# Patient Record
Sex: Male | Born: 1975
Health system: Southern US, Community
[De-identification: ages and names within clinical notes are randomized; demographics above are authoritative.]

## PROBLEM LIST (undated history)

## (undated) DIAGNOSIS — S2249XA Multiple fractures of ribs, unspecified side, initial encounter for closed fracture: Secondary | ICD-10-CM

## (undated) DIAGNOSIS — M329 Systemic lupus erythematosus, unspecified: Secondary | ICD-10-CM

## (undated) DIAGNOSIS — IMO0002 Reserved for concepts with insufficient information to code with codable children: Secondary | ICD-10-CM

## (undated) HISTORY — PX: SPLENECTOMY, PARTIAL: SHX787

---

## 2020-01-09 ENCOUNTER — Ambulatory Visit (HOSPITAL_COMMUNITY)
Admission: EM | Admit: 2020-01-09 | Discharge: 2020-01-09 | Disposition: A | Discharge status: Home / Self Care | Payer: Self-pay | Attending: Physician Assistant | Admitting: Physician Assistant

## 2020-01-09 ENCOUNTER — Other Ambulatory Visit: Payer: Self-pay

## 2020-01-09 ENCOUNTER — Ambulatory Visit (INDEPENDENT_AMBULATORY_CARE_PROVIDER_SITE_OTHER): Payer: Self-pay

## 2020-01-09 ENCOUNTER — Encounter (HOSPITAL_COMMUNITY): Payer: Self-pay

## 2020-01-09 DIAGNOSIS — R0781 Pleurodynia: Secondary | ICD-10-CM

## 2020-01-09 HISTORY — DX: Reserved for concepts with insufficient information to code with codable children: IMO0002

## 2020-01-09 HISTORY — DX: Multiple fractures of ribs, unspecified side, initial encounter for closed fracture: S22.49XA

## 2020-01-09 HISTORY — DX: Systemic lupus erythematosus, unspecified: M32.9

## 2020-01-09 MED ORDER — DICLOFENAC SODIUM 1 % EX GEL: 4.0000 g | Freq: Four times a day (QID) | CUTANEOUS | 0 refills | Status: DC

## 2020-01-09 MED ORDER — NAPROXEN 500 MG PO TABS
500.0000 mg | ORAL_TABLET | Freq: Two times a day (BID) | ORAL | 0 refills | Status: DC
Start: 2020-01-09 — End: 2020-04-26

## 2020-01-09 NOTE — ED Provider Notes (Signed)
[facility]    CSN: [id number] Arrival date & time: 01/09/20  1259      History   Chief Complaint Chief Complaint  Patient presents with  . Flank Pain    HPI Jacob Miller is a 44 y.o. male.   Patient with history of broken ribs on the left side, splenectomy and lupus presents for left lower rib pain.  He reports he had this pain on and off over the years.  He does report he had this 1 time when he had an issue with his lung requiring hospitalization and is concerned that his lungs have any issue.  Describes sharp pain left ribs worse with deep breath.  He reports is intermittent.  He has report is been a little more persistent today.  Reports it is tender to touch.  He reports certain movements make it worse when reaching above head.  He reports he works out and does a lot of overhead pulling exercises.  He has not fallen on the ribs.  Denies difficulty breathing outside of when he takes a deep breath and he feels the pain.  No central chest pain, no nausea or sweating.  No painful urination, frequency or blood in his urine.  Moving his bowels as per usual.  Patient recently quit smoking and was a heavy smoker prior.  He does endorse some coughing over the preceding weeks due to smoking.     Past Medical History:  Diagnosis Date  . Broken ribs   . Lupus (HCC)     There are no problems to display for this patient.   Past Surgical History:  Procedure Laterality Date  . SPLENECTOMY, PARTIAL         Home Medications    Prior to Admission medications   Medication Sig Start Date End Date Taking? Authorizing Provider  diclofenac Sodium (VOLTAREN) 1 % GEL Apply 4 g topically 4 (four) times daily. 01/09/20   Khari Lett, Veryl Speak, PA-C  naproxen (NAPROSYN) 500 MG tablet Take 1 tablet (500 mg total) by mouth 2 (two) times daily. 01/09/20   Meryn Sarracino, Veryl Speak, PA-C    Family History Family History  Family history unknown: Yes    Social History Social History   Tobacco  Use  . Smoking status: Not on file  Substance Use Topics  . Alcohol use: Not on file  . Drug use: Not on file     Allergies   Patient has no known allergies.   Review of Systems Review of Systems   Physical Exam Triage Vital Signs ED Triage Vitals  Enc Vitals Group     BP 01/09/20 1450 (!) 143/85     Pulse Rate 01/09/20 1450 58     Resp 01/09/20 1450 16     Temp 01/09/20 1450 98.6 F (37 C)     Temp Source 01/09/20 1450 Oral     SpO2 01/09/20 1450 97 %     Weight --      Height --      Head Circumference --      Peak Flow --      Pain Score 01/09/20 1455 8     Pain Loc --      Pain Edu? --      Excl. in GC? --    No data found.  Updated Vital Signs BP (!) 143/85 (BP Location: Right Arm)   Pulse 58   Temp 98.6 F (37 C) (Oral)   Resp 16   SpO2 97%  Visual Acuity Right Eye Distance:   Left Eye Distance:   Bilateral Distance:    Right Eye Near:   Left Eye Near:    Bilateral Near:     Physical Exam Vitals and nursing note reviewed.  Constitutional:      General: He is not in acute distress.    Appearance: He is well-developed. He is not ill-appearing.  HENT:     Head: Normocephalic and atraumatic.  Eyes:     Conjunctiva/sclera: Conjunctivae normal.  Cardiovascular:     Rate and Rhythm: Normal rate and regular rhythm.     Heart sounds: No murmur heard.   Pulmonary:     Effort: Pulmonary effort is normal. No respiratory distress.     Breath sounds: Normal breath sounds. No wheezing, rhonchi or rales.     Comments: Equal rise and fall the chest.  Lung sounds equal bilaterally through all fields. Abdominal:     Palpations: Abdomen is soft.     Tenderness: There is no abdominal tenderness.  Musculoskeletal:     Cervical back: Neck supple.     Comments: Tenderness to palpation along the left mid axillary lower ribs.  There is pain elicited with stretching of the arm above the head.  No pain with compression of thoracic cage.  There are no rashes.   No overlying erythema or ecchymosis  Skin:    General: Skin is warm and dry.  Neurological:     Mental Status: He is alert.      UC Treatments / Results  Labs (all labs ordered are listed, but only abnormal results are displayed) Labs Reviewed - No data to display  EKG   Radiology DG Chest 2 View  Result Date: 01/09/2020 CLINICAL DATA:  44 year old male with left side rib pain for 1 week. Tested negative last week for COVID-19. EXAM: CHEST - 2 VIEW COMPARISON:  None. FINDINGS: Cardiac size is at the upper limits of normal to mildly enlarged. Other mediastinal contours are within normal limits. Visualized tracheal air column is within normal limits. Both lungs appear clear. No pneumothorax or pleural effusion. No acute osseous abnormality identified. There is a small linear surgical clip or other retained foreign body adjacent to the splenic flexure beneath the left hemidiaphragm. Negative visible bowel gas pattern. IMPRESSION: Borderline to mild cardiomegaly. But no acute radiographic finding in the chest. Electronically Signed   By: Odessa Fleming M.D.   On: 01/09/2020 16:19    Procedures Procedures (including critical care time)  Medications Ordered in UC Medications - No data to display  Initial Impression / Assessment and Plan / UC Course  I have reviewed the triage vital signs and the nursing notes.  Pertinent labs & imaging results that were available during my care of the patient were reviewed by me and considered in my medical decision making (see chart for details).     #Left rib pain Patient is a 44 year old presenting with left rib pain.  X-ray obtained to rule out pneumothorax.  No sign of pneumothorax.  Given reproducibility on external exam doubt cardiac and doubt intrathoracic at this point.  Likely musculoskeletal or costochondritis.  Will treat with NSAIDs.  Return and ED precautions were discussed.  Instructed patient that he should establish with a primary care  provider.  Resource given.  Patient verbalized understanding plan of care Final Clinical Impressions(s) / UC Diagnoses   Final diagnoses:  Rib pain on left side     Discharge Instructions     I  have reviewed your chest xray, we do not see anything obvious with regard to your lungs  Take the naproxen if having frequent pains Use gel up to 4 times a day  Establish with Primary care, call the internal medicine center  If severe pain with difficulty breathing, please be evaluated by a medical provider      ED Prescriptions    Medication Sig Dispense Auth. Provider   naproxen (NAPROSYN) 500 MG tablet Take 1 tablet (500 mg total) by mouth 2 (two) times daily. 30 tablet Terrion Gencarelli, Veryl Speak, PA-C   diclofenac Sodium (VOLTAREN) 1 % GEL Apply 4 g topically 4 (four) times daily. 150 g Calin Ellery, Veryl Speak, PA-C     PDMP not reviewed this encounter.   Hermelinda Medicus, PA-C 01/09/20 1625

## 2020-01-09 NOTE — Discharge Instructions (Signed)
I have reviewed your chest xray, we do not see anything obvious with regard to your lungs  Take the naproxen if having frequent pains Use gel up to 4 times a day  Establish with Primary care, call the internal medicine center  If severe pain with difficulty breathing, please be evaluated by a medical provider

## 2020-01-09 NOTE — ED Triage Notes (Signed)
Pt c/o sharp pain to left flank area when taking a deep breath or coughing, denies recent injury, states he broke ribs on that side years ago

## 2020-04-26 ENCOUNTER — Other Ambulatory Visit: Payer: Self-pay

## 2020-04-26 ENCOUNTER — Encounter (HOSPITAL_COMMUNITY): Payer: Self-pay

## 2020-04-26 ENCOUNTER — Ambulatory Visit (HOSPITAL_COMMUNITY)
Admission: EM | Admit: 2020-04-26 | Discharge: 2020-04-26 | Disposition: A | Discharge status: Home / Self Care | Payer: Self-pay | Attending: Family Medicine | Admitting: Family Medicine

## 2020-04-26 DIAGNOSIS — K122 Cellulitis and abscess of mouth: Secondary | ICD-10-CM

## 2020-04-26 MED ORDER — CLINDAMYCIN HCL 300 MG PO CAPS
300.0000 mg | ORAL_CAPSULE | Freq: Three times a day (TID) | ORAL | 0 refills | Status: AC
Start: 2020-04-26 — End: ?

## 2020-04-26 MED ORDER — HYDROCODONE-ACETAMINOPHEN 7.5-325 MG PO TABS: 1.0000 | ORAL_TABLET | Freq: Four times a day (QID) | ORAL | 0 refills | Status: AC | PRN

## 2020-04-26 NOTE — ED Provider Notes (Signed)
[facility]    CSN: [id number] Arrival date & time: 04/26/20  2952      History   Chief Complaint Chief Complaint  Patient presents with  . Mouth Ulcer    HPI Jacob Miller is a 44 y.o. male.   HPI   Pain in the mouth, under the upper lip worsening for a couple of days Took 600 mg of ibuprofen without relief   Past Medical History:  Diagnosis Date  . Broken ribs   . Lupus (HCC)     There are no problems to display for this patient.   Past Surgical History:  Procedure Laterality Date  . SPLENECTOMY, PARTIAL         Home Medications    Prior to Admission medications   Medication Sig Start Date End Date Taking? Authorizing Provider  clindamycin (CLEOCIN) 300 MG capsule Take 1 capsule (300 mg total) by mouth 3 (three) times daily. 04/26/20   Jacob Moore, MD  HYDROcodone-acetaminophen (NORCO) 7.5-325 MG tablet Take 1 tablet by mouth every 6 (six) hours as needed for moderate pain. 04/26/20   Jacob Moore, MD    Family History Family History  Family history unknown: Yes    Social History Social History   Tobacco Use  . Smoking status: Not on file  Substance Use Topics  . Alcohol use: Not on file  . Drug use: Not on file     Allergies   Patient has no known allergies.   Review of Systems Review of Systems See HPI  Physical Exam Triage Vital Signs ED Triage Vitals  Enc Vitals Group     BP 04/26/20 0827 118/79     Pulse Rate 04/26/20 0827 98     Resp 04/26/20 0827 18     Temp 04/26/20 0827 98.3 F (36.8 C)     Temp Source 04/26/20 0827 Oral     SpO2 04/26/20 0827 98 %     Weight --      Height --      Head Circumference --      Peak Flow --      Pain Score 04/26/20 0828 6     Pain Loc --      Pain Edu? --      Excl. in GC? --    No data found.  Updated Vital Signs BP 118/79 (BP Location: Right Arm)   Pulse 98   Temp 98.3 F (36.8 C) (Oral)   Resp 18   SpO2 98%      Physical  Exam Constitutional:      General: He is not in acute distress.    Appearance: He is well-developed and normal weight.  HENT:     Head: Normocephalic and atraumatic.     Mouth/Throat:   Eyes:     Conjunctiva/sclera: Conjunctivae normal.     Pupils: Pupils are equal, round, and reactive to light.  Cardiovascular:     Rate and Rhythm: Normal rate.  Pulmonary:     Effort: Pulmonary effort is normal. No respiratory distress.  Abdominal:     General: There is no distension.     Palpations: Abdomen is soft.  Musculoskeletal:        General: Normal range of motion.     Cervical back: Normal range of motion.  Skin:    General: Skin is warm and dry.  Neurological:     Mental Status: He is alert.      UC Treatments / Results  Labs (all labs ordered are listed, but only abnormal results are displayed) Labs Reviewed - No data to display  EKG   Radiology No results found.  Procedures Procedures (including critical care time)  Medications Ordered in UC Medications - No data to display  Initial Impression / Assessment and Plan / UC Course  I have reviewed the triage vital signs and the nursing notes.  Pertinent labs & imaging results that were available during my care of the patient were reviewed by me and considered in my medical decision making (see chart for details).     Likely dental infection that is causing swelling of the gums and face.  Will treat with antibiotics warm compresses.  Return if this fails to improve.  Importance of dental care reviewed Final Clinical Impressions(s) / UC Diagnoses   Final diagnoses:  Oral infection     Discharge Instructions     Take the antibiotic as directed Warm compresses to area Take ibuprofen for moderate pain Take hydrocodone if pain severe See dentist in follow up   ED Prescriptions    Medication Sig Dispense Auth. Provider   clindamycin (CLEOCIN) 300 MG capsule Take 1 capsule (300 mg total) by mouth 3 (three)  times daily. 15 capsule Jacob Moore, MD   HYDROcodone-acetaminophen [facility]) 7.5-325 MG tablet Take 1 tablet by mouth every 6 (six) hours as needed for moderate pain. 10 tablet Jacob Moore, MD     I have reviewed the PDMP during this encounter.   Jacob Moore, MD 04/26/20 1327

## 2020-04-26 NOTE — ED Triage Notes (Signed)
Pt presents with mouth ulcer on top left side X 2 days.

## 2020-04-26 NOTE — Discharge Instructions (Addendum)
Take the antibiotic as directed Warm compresses to area Take ibuprofen for moderate pain Take hydrocodone if pain severe See dentist in follow up

## 2020-07-30 ENCOUNTER — Other Ambulatory Visit: Payer: Self-pay | Admitting: Physician Assistant

## 2020-07-30 DIAGNOSIS — F17200 Nicotine dependence, unspecified, uncomplicated: Secondary | ICD-10-CM | POA: Diagnosis not present

## 2020-07-30 DIAGNOSIS — N63 Unspecified lump in unspecified breast: Secondary | ICD-10-CM | POA: Diagnosis not present

## 2020-07-30 DIAGNOSIS — N6320 Unspecified lump in the left breast, unspecified quadrant: Secondary | ICD-10-CM

## 2020-07-30 DIAGNOSIS — M329 Systemic lupus erythematosus, unspecified: Secondary | ICD-10-CM | POA: Diagnosis not present

## 2020-08-29 ENCOUNTER — Ambulatory Visit
Admission: RE | Admit: 2020-08-29 | Discharge: 2020-08-29 | Disposition: A | Discharge status: Home / Self Care | Payer: 59 | Source: Ambulatory Visit | Attending: Physician Assistant | Admitting: Physician Assistant

## 2020-08-29 ENCOUNTER — Other Ambulatory Visit: Payer: Self-pay

## 2020-08-29 DIAGNOSIS — N6320 Unspecified lump in the left breast, unspecified quadrant: Secondary | ICD-10-CM

## 2020-08-29 DIAGNOSIS — R928 Other abnormal and inconclusive findings on diagnostic imaging of breast: Secondary | ICD-10-CM | POA: Diagnosis not present

## 2020-09-04 ENCOUNTER — Other Ambulatory Visit (HOSPITAL_COMMUNITY): Payer: Self-pay | Admitting: Internal Medicine

## 2020-09-04 MED FILL — HYDROXYCHLOROQUINE SULFATE: 200 | 30 days supply | Qty: 30 | Fill #0

## 2020-09-10 ENCOUNTER — Other Ambulatory Visit (HOSPITAL_COMMUNITY): Payer: Self-pay

## 2020-09-10 DIAGNOSIS — F17200 Nicotine dependence, unspecified, uncomplicated: Secondary | ICD-10-CM | POA: Diagnosis not present

## 2020-09-10 DIAGNOSIS — M329 Systemic lupus erythematosus, unspecified: Secondary | ICD-10-CM | POA: Diagnosis not present

## 2020-09-10 DIAGNOSIS — Z9081 Acquired absence of spleen: Secondary | ICD-10-CM | POA: Diagnosis not present

## 2020-09-10 DIAGNOSIS — Z79899 Other long term (current) drug therapy: Secondary | ICD-10-CM | POA: Diagnosis not present

## 2020-09-10 DIAGNOSIS — R634 Abnormal weight loss: Secondary | ICD-10-CM | POA: Diagnosis not present

## 2020-09-10 DIAGNOSIS — N182 Chronic kidney disease, stage 2 (mild): Secondary | ICD-10-CM | POA: Diagnosis not present

## 2020-09-10 DIAGNOSIS — M2550 Pain in unspecified joint: Secondary | ICD-10-CM | POA: Diagnosis not present

## 2020-09-10 MED FILL — predniSONE 10 MG TABS: 10 | 8 days supply | Qty: 20 | Fill #0

## 2020-09-10 MED FILL — IBUPROFEN 600 MG TABLET: 600 | 30 days supply | Qty: 60 | Fill #0

## 2020-09-25 ENCOUNTER — Other Ambulatory Visit (HOSPITAL_COMMUNITY): Payer: Self-pay

## 2020-09-27 ENCOUNTER — Other Ambulatory Visit (HOSPITAL_COMMUNITY): Payer: Self-pay

## 2020-10-02 ENCOUNTER — Other Ambulatory Visit (HOSPITAL_COMMUNITY): Payer: Self-pay

## 2020-10-16 DIAGNOSIS — H524 Presbyopia: Secondary | ICD-10-CM | POA: Diagnosis not present

## 2020-10-28 ENCOUNTER — Other Ambulatory Visit (HOSPITAL_COMMUNITY): Payer: Self-pay

## 2020-10-29 ENCOUNTER — Other Ambulatory Visit (HOSPITAL_COMMUNITY): Payer: Self-pay

## 2020-10-29 MED ORDER — HYDROXYCHLOROQUINE SULFATE 200 MG PO TABS
200.0000 mg | ORAL_TABLET | Freq: Every day | ORAL | 1 refills | Status: AC
  Filled 2020-10-29: qty 90, 90d supply, fill #0

## 2020-10-29 MED FILL — Hydroxychloroquine Sulfate Tab 200 MG: ORAL | 90 days supply | Qty: 90 | Fill #0 | Status: AC

## 2021-03-20 MED FILL — Ibuprofen Tab 600 MG: ORAL | 30 days supply | Qty: 60 | Fill #0 | Status: CN

## 2021-03-21 ENCOUNTER — Other Ambulatory Visit (HOSPITAL_COMMUNITY): Payer: Self-pay

## 2021-04-01 ENCOUNTER — Other Ambulatory Visit (HOSPITAL_COMMUNITY): Payer: Self-pay

## 2021-07-11 IMAGING — MG DIGITAL DIAGNOSTIC BILAT W/ TOMO W/ CAD
8 series · 9 of 24 positions shown · non-contrast
Comparison: Prior films

CLINICAL DATA: Palpable lump left breast for 20 years, unchanged.

EXAM:
DIGITAL DIAGNOSTIC BILATERAL MAMMOGRAM WITH TOMOSYNTHESIS AND CAD;
ULTRASOUND LEFT BREAST LIMITED
TECHNIQUE: Bilateral digital diagnostic mammography and breast tomosynthesis
was performed. The images were evaluated with computer-aided
detection.; Targeted ultrasound examination of the left breast was
performed

[L TAN synth-2D]
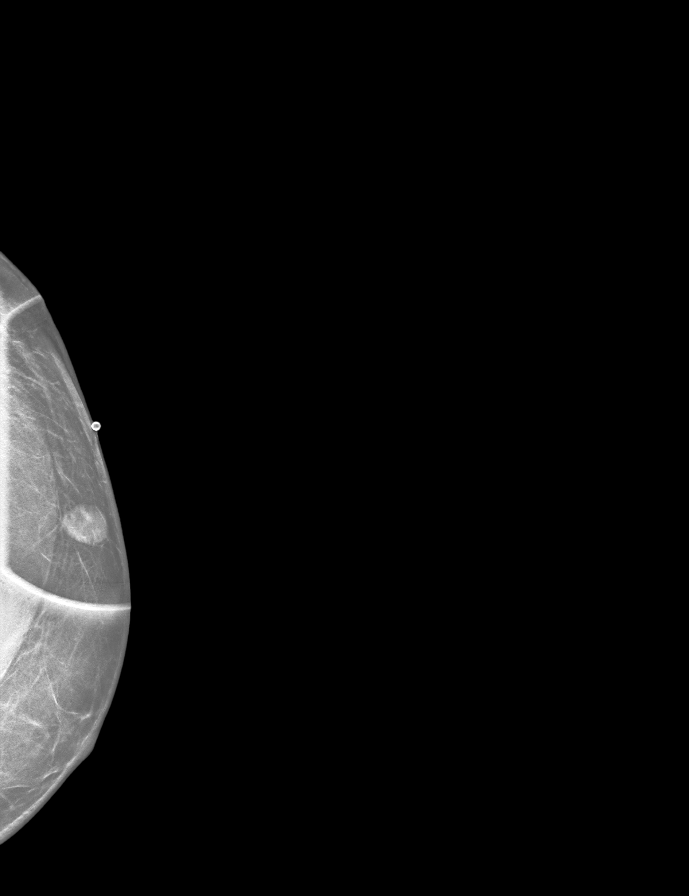

[R CC synth-2D]
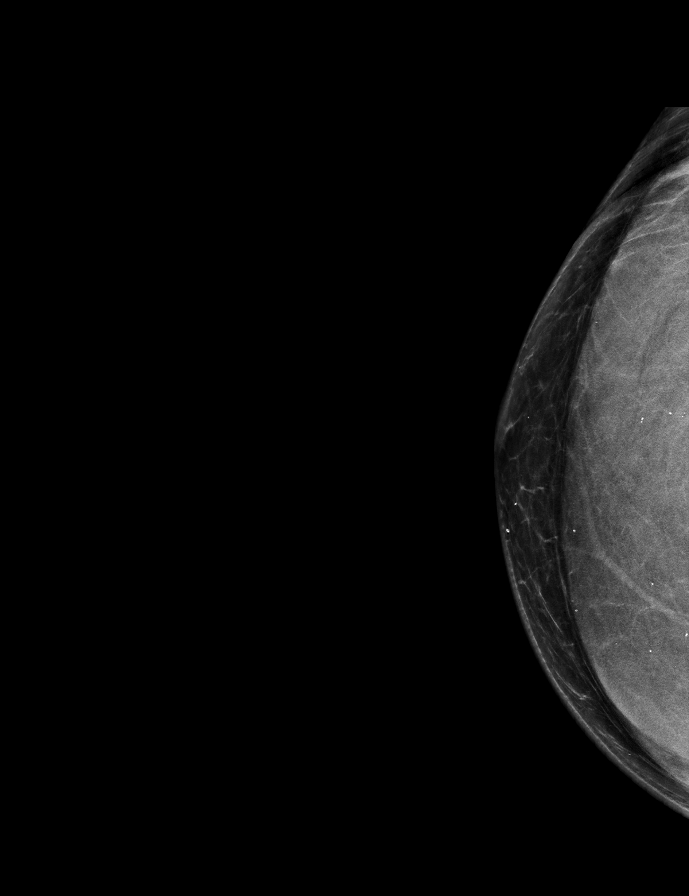

[R MLO synth-2D]
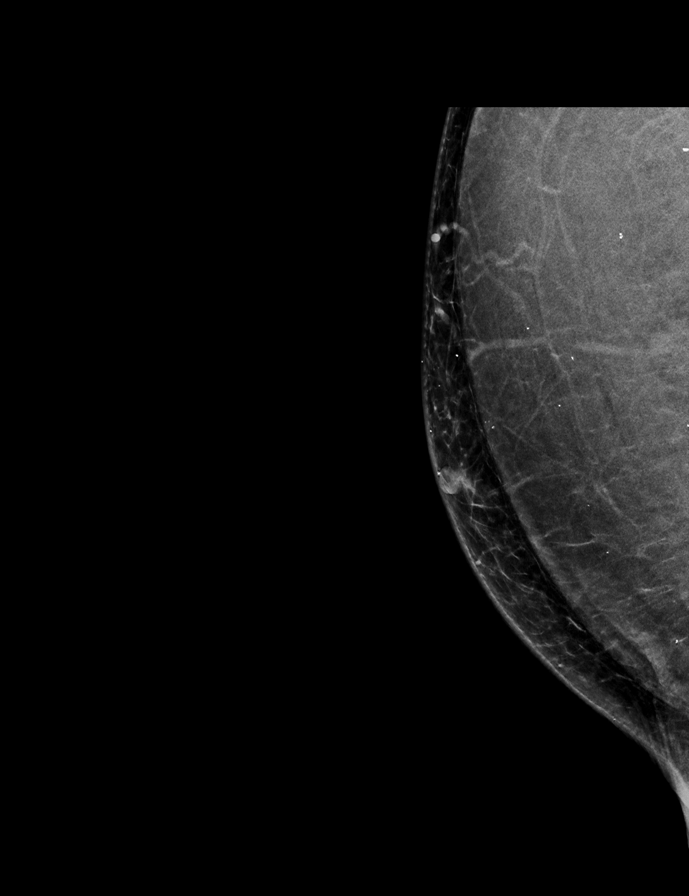

[L CC synth-2D]
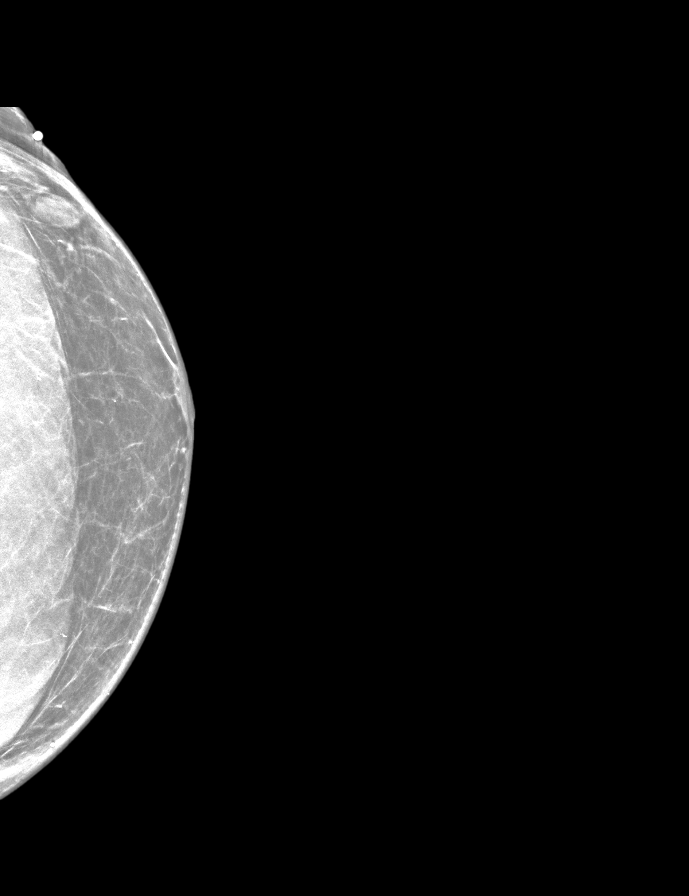

[R CC tomo · 2 of 58 frames shown]
[frame 19/58]
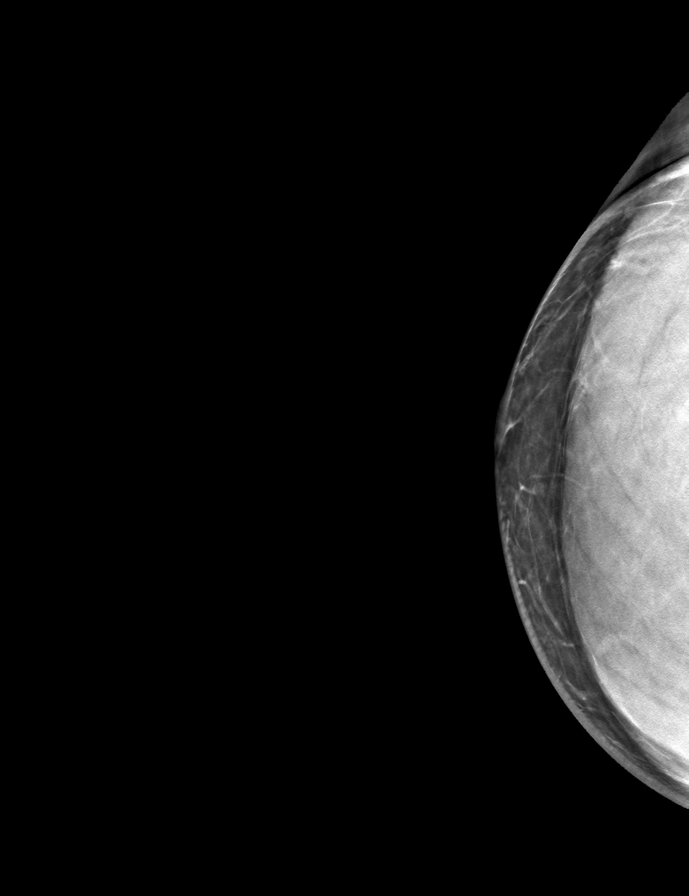
[frame 29/58]
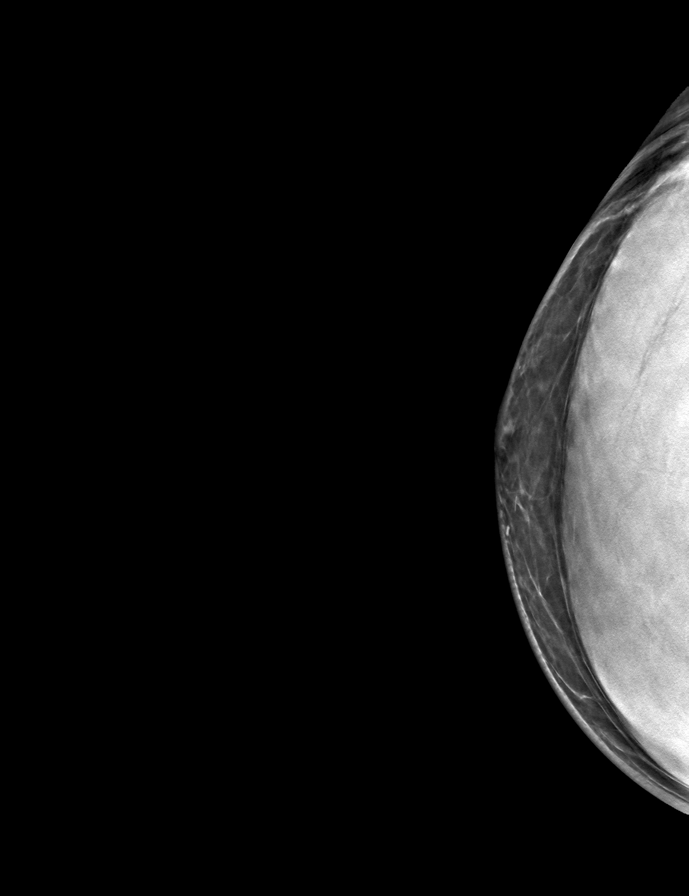

[R MLO tomo · tomo slice 27/52.0]
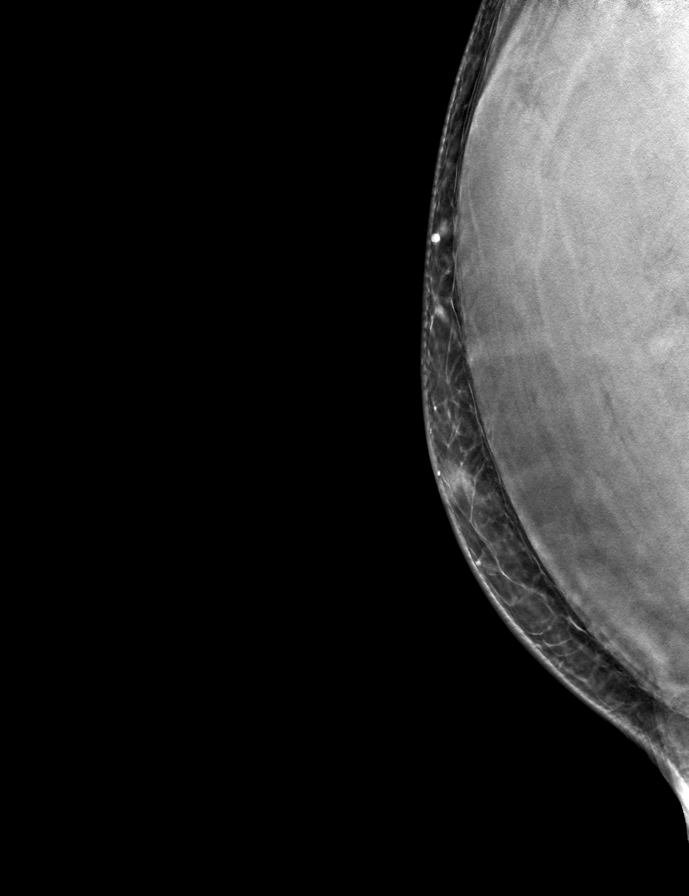

[L CC tomo · tomo slice 23/45.0]
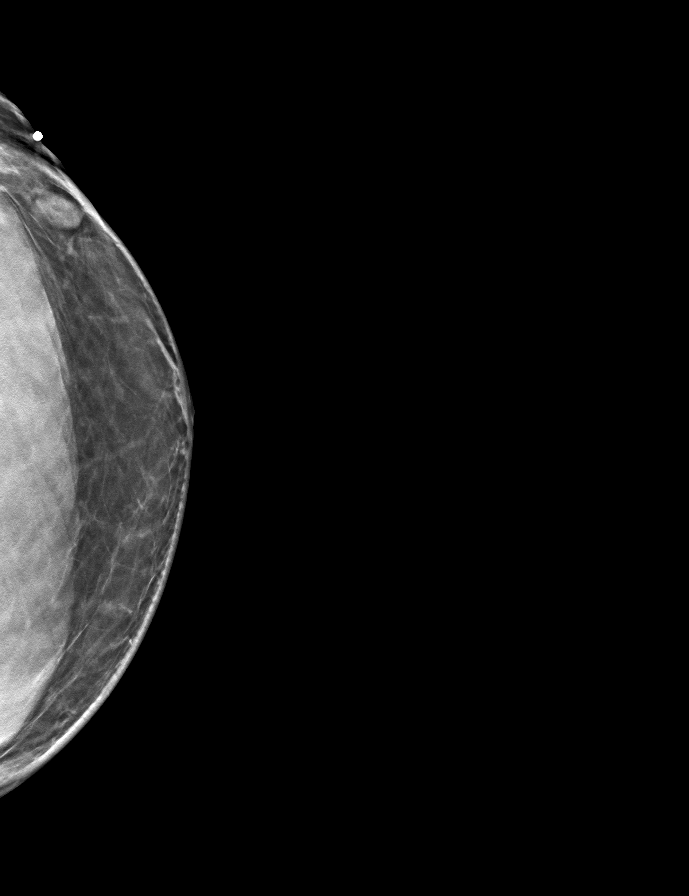

[L MLO tomo · tomo slice 20/39.0]
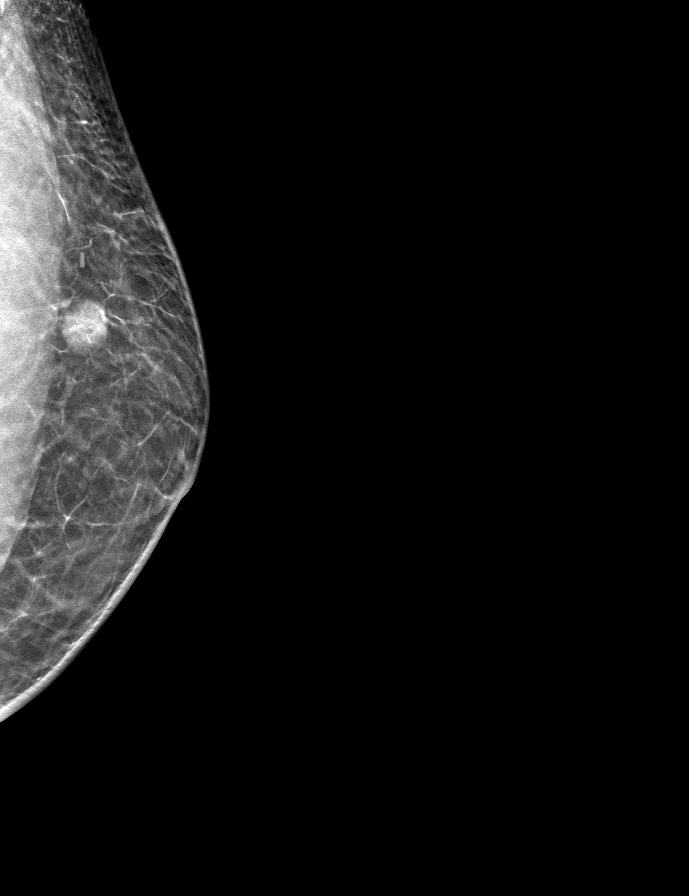

[9 of 24 positions shown; findings below may reference images not displayed]

ACR Breast Density Category b: There are scattered areas of
fibroglandular density.
FINDINGS: Cc and MLO views breasts, spot tangential view of left breast are
submitted. There is a well-defined mass in the palpable area
upper-outer quadrant left breast. The right breasts is negative.

Targeted ultrasound is performed, showing normal intramammary lymph
node at the left breast 2 o'clock 4 cm from nipple palpable area
measuring 1.4 x 0.6 x 1 cm. This correlates to the mammographic
finding.
IMPRESSION: Benign findings.

RECOMMENDATION:
Management on clinical basis.

I have discussed the findings and recommendations with the patient.
If applicable, a reminder letter will be sent to the patient
regarding the next appointment.

BI-RADS CATEGORY  2: Benign.
